# Patient Record
Sex: Female | Born: 1955 | Race: White | Hispanic: No | Smoking: Never smoker
Health system: Southern US, Community
[De-identification: ages and names within clinical notes are randomized; demographics above are authoritative.]

## PROBLEM LIST (undated history)

## (undated) DIAGNOSIS — N3010 Interstitial cystitis (chronic) without hematuria: Secondary | ICD-10-CM

## (undated) DIAGNOSIS — M1990 Unspecified osteoarthritis, unspecified site: Secondary | ICD-10-CM

## (undated) DIAGNOSIS — F419 Anxiety disorder, unspecified: Secondary | ICD-10-CM

## (undated) DIAGNOSIS — K3184 Gastroparesis: Secondary | ICD-10-CM

## (undated) DIAGNOSIS — E079 Disorder of thyroid, unspecified: Secondary | ICD-10-CM

## (undated) HISTORY — DX: Unspecified osteoarthritis, unspecified site: M19.90

## (undated) HISTORY — DX: Disorder of thyroid, unspecified: E07.9

## (undated) HISTORY — DX: Anxiety disorder, unspecified: F41.9

## (undated) HISTORY — DX: Gastroparesis: K31.84

## (undated) HISTORY — DX: Interstitial cystitis (chronic) without hematuria: N30.10

## (undated) HISTORY — PX: SPINE SURGERY: SHX786

---

## 2010-05-01 ENCOUNTER — Emergency Department: Payer: Self-pay | Admitting: Emergency Medicine

## 2015-02-06 ENCOUNTER — Other Ambulatory Visit: Payer: Self-pay | Admitting: Family Medicine

## 2015-02-06 DIAGNOSIS — R1900 Intra-abdominal and pelvic swelling, mass and lump, unspecified site: Secondary | ICD-10-CM

## 2015-02-13 ENCOUNTER — Ambulatory Visit
Admission: RE | Admit: 2015-02-13 | Discharge: 2015-02-13 | Disposition: A | Discharge status: Home / Self Care | Payer: BC Managed Care – PPO | Source: Ambulatory Visit | Attending: Family Medicine | Admitting: Family Medicine

## 2015-02-13 DIAGNOSIS — R1900 Intra-abdominal and pelvic swelling, mass and lump, unspecified site: Secondary | ICD-10-CM

## 2015-03-13 ENCOUNTER — Other Ambulatory Visit: Payer: Self-pay

## 2015-03-13 DIAGNOSIS — Z1231 Encounter for screening mammogram for malignant neoplasm of breast: Secondary | ICD-10-CM

## 2015-03-25 ENCOUNTER — Other Ambulatory Visit: Payer: Self-pay | Admitting: General Surgery

## 2015-03-25 DIAGNOSIS — R140 Abdominal distension (gaseous): Secondary | ICD-10-CM

## 2015-04-03 ENCOUNTER — Ambulatory Visit
Admission: RE | Admit: 2015-04-03 | Discharge: 2015-04-03 | Disposition: A | Discharge status: Home / Self Care | Payer: BC Managed Care – PPO | Source: Ambulatory Visit

## 2015-04-03 DIAGNOSIS — Z1231 Encounter for screening mammogram for malignant neoplasm of breast: Secondary | ICD-10-CM

## 2015-04-05 ENCOUNTER — Other Ambulatory Visit: Payer: Self-pay | Admitting: General Surgery

## 2015-04-05 DIAGNOSIS — R109 Unspecified abdominal pain: Secondary | ICD-10-CM

## 2015-04-08 ENCOUNTER — Ambulatory Visit: Payer: BC Managed Care – PPO | Attending: General Surgery | Admitting: Physical Therapy

## 2015-04-08 ENCOUNTER — Encounter: Payer: Self-pay | Admitting: Physical Therapy

## 2015-04-08 DIAGNOSIS — R109 Unspecified abdominal pain: Secondary | ICD-10-CM | POA: Insufficient documentation

## 2015-04-08 DIAGNOSIS — N8184 Pelvic muscle wasting: Secondary | ICD-10-CM | POA: Insufficient documentation

## 2015-04-08 DIAGNOSIS — M6289 Other specified disorders of muscle: Secondary | ICD-10-CM

## 2015-04-08 NOTE — Patient Instructions (Signed)
About Abdominal Massage  Abdominal massage, also called external colon massage, is a self-treatment circular massage technique that can reduce and eliminate gas and ease constipation. The colon naturally contracts in waves in a clockwise direction starting from inside the right hip, moving up toward the ribs, across the belly, and down inside the left hip.  When you perform circular abdominal massage, you help stimulate your colon's normal wave pattern of movement called peristalsis.  It is most beneficial when done after eating.  Positioning You can practice abdominal massage with oil while lying down, or in the shower with soap.  Some people find that it is just as effective to do the massage through clothing while sitting or standing.  How to Massage Start by placing your finger tips or knuckles on your right side, just inside your hip bone.  . Make small circular movements while you move upward toward your rib cage.   . Once you reach the bottom right side of your rib cage, take your circular movements across to the left side of the bottom of your rib cage.  . Next, move downward until you reach the inside of your left hip bone.  This is the path your feces travel in your colon. . Continue to perform your abdominal massage in this pattern for 10 minutes each day.     You can apply as much pressure as is comfortable in your massage.  Start gently and build pressure as you continue to practice.  Notice any areas of pain as you massage; areas of slight pain may be relieved as you massage, but if you have areas of significant or intense pain, consult with your healthcare provider.  Other Considerations . General physical activity including bending and stretching can have a beneficial massage-like effect on the colon.  Deep breathing can also stimulate the colon because breathing deeply activates the same nervous system that supplies the colon.   Abdominal massage should always be used in  combination with a bowel-conscious diet that is high in the proper type of fiber for you, fluids (primarily water), and a regular exercise program.   Butterfly, Supine    Lie on back, feet together. Lower knees toward floor. Hold _30__ seconds. Repeat _2_ times per session. Do _2__ sessions per day.  Copyright  VHI. All rights reserved.   Toileting Techniques for Bowel Movements (Defecation) Using your belly (abdomen) and pelvic floor muscles to have a bowel movement is usually instinctive.  Sometimes people can have problems with these muscles and have to relearn proper defecation (emptying) techniques.  If you have weakness in your muscles, organs that are falling out, decreased sensation in your pelvis, or ignore your urge to go, you may find yourself straining to have a bowel movement.  You are straining if you are: . holding your breath or taking in a huge gulp of air and holding it  . keeping your lips and jaw tensed and closed tightly . turning red in the face because of excessive pushing or forcing . developing or worsening your  hemorrhoids . getting faint while pushing . not emptying completely and have to defecate many times a day  If you are straining, you are actually making it harder for yourself to have a bowel movement.  Many people find they are pulling up with the pelvic floor muscles and closing off instead of opening the anus. Due to lack pelvic floor relaxation and coordination the abdominal muscles, one has to work harder to push  the feces out.  Many people have never been taught how to defecate efficiently and effectively.  Notice what happens to your body when you are having a bowel movement.  While you are sitting on the toilet pay attention to the following areas: . Jaw and mouth position . Angle of your hips   . Whether your feet touch the ground or not . Arm placement  . Spine position . Waist . Belly tension . Anus (opening of the anal canal)  An  Evacuation/Defecation Plan   Here are the 4 basic points:  1. Lean forward enough for your elbows to rest on your knees 2. Support your feet on the floor or use a low stool if your feet don't touch the floor  3. Push out your belly as if you have swallowed a beach ball-you should feel a widening of your waist 4. Open and relax your pelvic floor muscles, rather than tightening around the anus      The following conditions my require modifications to your toileting posture:  . If you have had surgery in the past that limits your back, hip, pelvic, knee or ankle flexibility . Constipation   Your healthcare practitioner may make the following additional suggestions and adjustments:  1) Sit on the toilet  a) Make sure your feet are supported. b) Notice your hip angle and spine position-most people find it effective to lean forward or raise their knees, which can help the muscles around the anus to relax  c) When you lean forward, place your forearms on your thighs for support  2) Relax suggestions a) Breath deeply in through your nose and out slowly through your mouth as if you are smelling the flowers and blowing out the candles. b) To become aware of how to relax your muscles, contracting and releasing muscles can be helpful.  Pull your pelvic floor muscles in tightly by using the image of holding back gas, or closing around the anus (visualize making a circle smaller) and lifting the anus up and in.  Then release the muscles and your anus should drop down and feel open. Repeat 5 times ending with the feeling of relaxation. c) Keep your pelvic floor muscles relaxed; let your belly bulge out. d) The digestive tract starts at the mouth and ends at the anal opening, so be sure to relax both ends of the tube.  Place your tongue on the roof of your mouth with your teeth separated.  This helps relax your mouth and will help to relax the anus at the same time.  3) Empty (defecation) a) Keep your  pelvic floor and sphincter relaxed, then bulge your anal muscles.  Make the anal opening wide.  b) Stick your belly out as if you have swallowed a beach ball. c) Make your belly wall hard using your belly muscles while continuing to breathe. Doing this makes it easier to open your anus. d) Breath out and give a grunt (or try using other sounds such as ahhhh, shhhhh, ohhhh or grrrrrrr).  4) Finish a) As you finish your bowel movement, pull the pelvic floor muscles up and in.  This will leave your anus in the proper place rather than remaining pushed out and down. If you leave your anus pushed out and down, it will start to feel as though that is normal and give you incorrect signals about needing to have a bowel movement.    Earlie Counts, PT [facility]  [address] [place], [place] [address] [facility] [address], [address] [place], [place] [address] Phone # [phone] Fax [phone]

## 2015-04-08 NOTE — Therapy (Signed)
[facility] [address] [place], [place], [address] Phone: [phone]   Fax:  [phone]  Physical Therapy Evaluation  Patient Details  Name: Selena Mckay MRN: [id number] Date of Birth: May 03, 1955 Referring Provider: Dr. Almond Lint  Encounter Date: 04/08/2015      PT End of Session - 04/08/15 1629    Visit Number 1   Date for PT Re-Evaluation 08/06/15   PT Start Time 1445   PT Stop Time 1530   PT Time Calculation (min) 45 min   Activity Tolerance Patient tolerated treatment well   Behavior During Therapy Anxious      Past Medical History  Diagnosis Date  . Arthritis     Rheumatoid arthritis  . Gastroparesis   . Interstitial cystitis   . Anxiety   . Thyroid disease     Past Surgical History  Procedure Laterality Date  . Spine surgery      3 posterior fusions; 1 anterior fusion    There were no vitals filed for this visit.  Visit Diagnosis:  Pelvic floor dysfunction - Plan: PT plan of care cert/re-cert  Abdominal pain, unspecified abdominal location - Plan: PT plan of care cert/re-cert      Subjective Assessment - 04/08/15 1455    Subjective Patient has to sleep with hot pack to relax bowels for gas.  Patient reports hard knots in intestines.  Patient reports after her anterior back fusion has had increased bowel function.  Patient reports incontinence issues.  Patient reports the bowels are soft and pencil thin. Patient has 1 bowel movement per day that are thin.  Patient feels bloated.    Patient Stated Goals massage out adhesions to improve quality of life.   Currently in Pain? Yes   Pain Score 5    Pain Location Abdomen   Pain Orientation Mid   Pain Descriptors / Indicators Aching;Heaviness;Discomfort  bloated   Pain Type Chronic pain   Pain Onset More than a month ago   Pain Frequency Constant   Aggravating Factors  sinus drainage, anxiety   Pain Relieving Factors hot pack   Multiple  Pain Sites No            [facility] PT Assessment - 04/08/15 0001    Assessment   Medical Diagnosis R10.9 Abdominal wall pain   Referring Provider Dr. Almond Lint   Onset Date/Surgical Date 06/26/08   Prior Therapy None   Precautions   Precautions Back  fusions   Balance Screen   Has the patient fallen in the past 6 months No   Has the patient had a decrease in activity level because of a fear of falling?  No   Is the patient reluctant to leave their home because of a fear of falling?  No   Prior Function   Level of Independence Independent   Leisure walking, back exercises   Cognition   Overall Cognitive Status Within Functional Limits for tasks assessed   Observation/Other Assessments   Observations Patient is very anxious   Focus on Therapeutic Outcomes (FOTO)  57% limitation CK   ROM / Strength   AROM / PROM / Strength Strength   Palpation   Palpation comment tenderness located in left abdominal wall,Healed scar on abdomen with thickness under scar                 Pelvic Floor Special Questions - 04/08/15 0001    Prior Pregnancies Yes   Number of Pregnancies 2  Number of Vaginal Deliveries 2   Any difficulty with labor and deliveries No   Currently Sexually Active Yes   Is this Painful Yes   Marinoff Scale pain interrupts completion   Urinary Leakage Yes   Activities that cause leaking --  straining to pass gas   Fecal incontinence No  strain for bowel movement   Falling out feeling (prolapse) Yes                  PT Education - 04/08/15 1629    Education provided Yes   Education Details website to work on relaxation, Correct toileting technique to relax the pelvic floor, squatty potty, abdominal massage   Person(s) Educated Patient   Methods Explanation;Demonstration;Verbal cues;Handout;Tactile cues   Comprehension Returned demonstration;Verbalized understanding          PT Short Term Goals - 04/08/15 1638    PT SHORT TERM GOAL #1    Title understand correct toileting technique to decrease straining   Time 4   Period Weeks   Status New   PT SHORT TERM GOAL #2   Title relaxation technique with audio and diaphragmatic breathing   Time 4   Period Weeks   Status New   PT SHORT TERM GOAL #3   Title understand how to relax pelvic floor while having a bowel movement   Time 4   Period Weeks   Status New   PT SHORT TERM GOAL #4   Title understand how to perform abdominal massage to  assist bowels to move through the intestines   Time 4   Period Weeks   Status New           PT Long Term Goals - 04/08/15 1641    PT LONG TERM GOAL #1   Title independent with HEP and how to manage her pain   Time 4   Period Months   Status New   PT LONG TERM GOAL #2   Title pain at night in the abdomen decreased >/= 50% using relaxation techniques and massage   Time 4   Period Months   Status New   PT LONG TERM GOAL #3   Title have a bowel movement with >/= 50% greater ease due to understanding how to relax the pelvic floor as she pushes   Time 4   Period Months   Status New   PT LONG TERM GOAL #4   Title feels her quality of life has improved >/= 50% due to reduced pain   Time 4   Status New               Plan - 04/08/15 1630    Clinical Impression Statement Patient is a 60 year old female with diagnosis of abdominal pain.Patient reports she has constipation issues with straining.  Patient has 1 bowel movement per day that is very skinny.  Patient will leak urine when passing gas.  Patient has abdominal pain at level  5/10 and she will massage the area vigorusly or use a hot pack.  Patient sleeps with a hot pack on her abdomen and has mottling of her abdominal skin from the heat.  Patient is very anxious with her syptoms and has trouble relaxing.  Patient resting level and muscle recruitment with pelvic floor relaxation and contraction will be assessed at next visit.  Patient  would benefit form phsycial therapy to  redue pain in abdomen, educate on how to relax pelvic floor muscles to have a bowel movement  or gas, pain management techniques to redue the amount of heat to her abdomen.     Pt will benefit from skilled therapeutic intervention in order to improve on the following deficits Decreased strength;Decreased coordination;Pain;Increased muscle spasms   Rehab Potential Good   Clinical Impairments Affecting Rehab Potential None   PT Frequency 1x / week   PT Duration Other (comment)  4 months   PT Treatment/Interventions ADLs/Self Care Home Management;Biofeedback;Moist Heat;Therapeutic exercise;Electrical Stimulation;Cryotherapy;Therapeutic activities;Ultrasound;Neuromuscular re-education;Patient/family education;Manual techniques;Scar mobilization  Home TENS unit   PT Next Visit Plan pelvic floor EMG, flexibility exercises with legs on wall   PT Home Exercise Plan flexibility exercises with feet on wall,, diaphragmatic breathing   Recommended Other Services None   Consulted and Agree with Plan of Care Patient         Problem List There are no active problems to display for this patient.   Eulis FosterCheryl Gray, PT 04/08/2015 4:46 PM    [facility] [address] [place], [place], [address] Phone: [phone]   Fax:  [phone]  Name: Selena Mckay MRN: [id number] Date of Birth: 08/18/1955

## 2015-04-16 ENCOUNTER — Encounter: Payer: Self-pay | Admitting: Physical Therapy

## 2015-04-16 ENCOUNTER — Ambulatory Visit: Payer: BC Managed Care – PPO | Admitting: Physical Therapy

## 2015-04-16 DIAGNOSIS — N8184 Pelvic muscle wasting: Secondary | ICD-10-CM | POA: Diagnosis not present

## 2015-04-16 DIAGNOSIS — R109 Unspecified abdominal pain: Secondary | ICD-10-CM

## 2015-04-16 DIAGNOSIS — M6289 Other specified disorders of muscle: Secondary | ICD-10-CM

## 2015-04-16 NOTE — Therapy (Signed)
[facility] [address] [place], [place], [address] Phone: [phone]   Fax:  [phone]  Physical Therapy Treatment  Patient Details  Name: Selena Mckay MRN: [id number] Date of Birth: 1955/05/03 Referring Provider: Dr. Stark Klein  Encounter Date: 04/16/2015      PT End of Session - 04/16/15 1528    Visit Number 2   Date for PT Re-Evaluation 08/06/15   PT Start Time 1445   PT Stop Time 1528   PT Time Calculation (min) 43 min   Activity Tolerance Patient tolerated treatment well   Behavior During Therapy Anxious      Past Medical History  Diagnosis Date  . Arthritis     Rheumatoid arthritis  . Gastroparesis   . Interstitial cystitis   . Anxiety   . Thyroid disease     Past Surgical History  Procedure Laterality Date  . Spine surgery      3 posterior fusions; 1 anterior fusion    There were no vitals filed for this visit.  Visit Diagnosis:  Pelvic floor dysfunction  Abdominal pain, unspecified abdominal location      Subjective Assessment - 04/16/15 1448    Subjective Today I have alot of sinus drainage today causing lots of stomach gas.  Abdominal massage will work when gas is mild.  When gas is severe massage does not help.  Deep breaths will help with gastroparesis.   Istarted back with the meditation.    Patient Stated Goals massage out adhesions to improve quality of life.   Currently in Pain? Yes   Pain Score 4    Pain Location Chest   Pain Orientation Upper   Pain Descriptors / Indicators Tiring   Pain Type Chronic pain   Pain Onset More than a month ago   Pain Frequency Constant   Aggravating Factors  sinus drainage, anxiety   Pain Relieving Factors hot pack   Multiple Pain Sites No                         [facility] Adult PT Treatment/Exercise - 04/16/15 0001    Manual Therapy   Manual Therapy Soft tissue mobilization;Myofascial release   Soft tissue mobilization  scar massage to abdomin; diaphragm bil. ; lift up of scar   Myofascial Release abdominal , lower intestine area, pubivesical ligaments                PT Education - 04/16/15 1525    Education provided Yes   Education Details butterfly stretch, hamstring stretch   Person(s) Educated Patient   Methods Explanation;Handout;Verbal cues   Comprehension Verbalized understanding          PT Short Term Goals - 04/16/15 1530    PT SHORT TERM GOAL #1   Title understand correct toileting technique to decrease straining   Time 4   Period Weeks   Status Achieved   PT SHORT TERM GOAL #2   Title relaxation technique with audio and diaphragmatic breathing   Time 4   Period Weeks   Status On-going   PT SHORT TERM GOAL #3   Title understand how to relax pelvic floor while having a bowel movement   Time 4   Period Weeks   Status On-going   PT SHORT TERM GOAL #4   Title understand how to perform abdominal massage to  assist bowels to move through the intestines   Time 4   Period Weeks  Status Achieved           PT Long Term Goals - 04/08/15 1641    PT LONG TERM GOAL #1   Title independent with HEP and how to manage her pain   Time 4   Period Months   Status New   PT LONG TERM GOAL #2   Title pain at night in the abdomen decreased >/= 50% using relaxation techniques and massage   Time 4   Period Months   Status New   PT LONG TERM GOAL #3   Title have a bowel movement with >/= 50% greater ease due to understanding how to relax the pelvic floor as she pushes   Time 4   Period Months   Status New   PT LONG TERM GOAL #4   Title feels her quality of life has improved >/= 50% due to reduced pain   Time 4   Status New               Plan - 04/16/15 1528    Clinical Impression Statement Patient is a 59 year old female with diagnosis of abdominal pain.  Patient is performing relaxation exercises at home.  Patient had increased softness of scar and abdomen after soft  tissue work and good intestinal sounds.  Patient has not met goals yet due to only attending 1 session. patient will benefit from physical therapy to reduce pain .    Pt will benefit from skilled therapeutic intervention in order to improve on the following deficits Decreased strength;Decreased coordination;Pain;Increased muscle spasms   Rehab Potential Good   Clinical Impairments Affecting Rehab Potential None   PT Frequency 1x / week   PT Duration Other (comment)  4 months   PT Treatment/Interventions ADLs/Self Care Home Management;Biofeedback;Moist Heat;Therapeutic exercise;Electrical Stimulation;Cryotherapy;Therapeutic activities;Ultrasound;Neuromuscular re-education;Patient/family education;Manual techniques;Scar mobilization  home TENS unit   PT Next Visit Plan pelvic floor EMG, flexibility exercises with legs on wall   PT Home Exercise Plan flexibility exercises with feet on wall,, diaphragmatic breathing   Consulted and Agree with Plan of Care Patient        Problem List There are no active problems to display for this patient.   Cheryl Gray, PT 04/16/2015 3:31 PM    [facility] [address] [place], [place], [address] Phone: [phone]   Fax:  [phone]  Name: Cathye Tagg MRN: [id number] Date of Birth: 04/30/1955     

## 2015-04-16 NOTE — Patient Instructions (Signed)
Hip Adductor: Wall Stretch    Lie on back with hips against wall, back of thighs on wall. Pull legs apart until stretch is felt in inner thighs. Hold _30__ seconds. Relax. Repeat _2__ times. Do __1_ times a day. Advanced: At end of stretch, rotate thighs outward. Can do the butterfly stretch too.  Copyright  VHI. All rights reserved.  [facility] [address] [place], [place] [address] Phone # [phone] Fax [phone]

## 2015-04-23 ENCOUNTER — Ambulatory Visit: Payer: BC Managed Care – PPO | Admitting: Physical Therapy

## 2015-04-23 ENCOUNTER — Encounter: Payer: Self-pay | Admitting: Physical Therapy

## 2015-04-23 DIAGNOSIS — M6289 Other specified disorders of muscle: Secondary | ICD-10-CM

## 2015-04-23 DIAGNOSIS — R109 Unspecified abdominal pain: Secondary | ICD-10-CM

## 2015-04-23 DIAGNOSIS — N8184 Pelvic muscle wasting: Secondary | ICD-10-CM | POA: Diagnosis not present

## 2015-04-23 NOTE — Therapy (Signed)
[facility] [address] [place], [place], [address] Phone: [phone]   Fax:  [phone]  Physical Therapy Treatment  Patient Details  Name: Selena Mckay MRN: [id number] Date of Birth: [birth year] Referring Provider: Dr. Almond Lint  Encounter Date: 04/23/2015      PT End of Session - 04/23/15 1445    Visit Number 3   Date for PT Re-Evaluation 08/06/15   PT Start Time 1445   PT Stop Time 1525   PT Time Calculation (min) 40 min   Activity Tolerance Patient tolerated treatment well   Behavior During Therapy Anxious      Past Medical History  Diagnosis Date  . Arthritis     Rheumatoid arthritis  . Gastroparesis   . Interstitial cystitis   . Anxiety   . Thyroid disease     Past Surgical History  Procedure Laterality Date  . Spine surgery      3 posterior fusions; 1 anterior fusion    There were no vitals filed for this visit.  Visit Diagnosis:  Pelvic floor dysfunction  Abdominal pain, unspecified abdominal location      Subjective Assessment - 04/23/15 1445    Subjective I am coping better and have less stress.  I am still having alot of abdominal gas.   I am not panicing as much as I used to. My stomach is softer.    Patient Stated Goals massage out adhesions to improve quality of life.   Currently in Pain? Yes   Pain Score 8   during day is 2/10   Pain Location Abdomen   Pain Orientation Lower;Mid   Pain Descriptors / Indicators Constant;Tightness   Pain Type Chronic pain   Pain Onset More than a month ago   Pain Frequency Constant   Aggravating Factors  sinus drainage, anxiety   Pain Relieving Factors hot pack   Multiple Pain Sites No                         [facility] Adult PT Treatment/Exercise - 04/23/15 0001    Manual Therapy   Manual Therapy Soft tissue mobilization;Myofascial release   Soft tissue mobilization scar massage to abdomin; diaphragm bil. ; lift up of  scar   Myofascial Release abdominal , lower intestine area, pubivesical ligaments; around the large intestines,                 PT Education - 04/23/15 1527    Education provided Yes   Education Details diaphragm   Person(s) Educated Patient   Methods Explanation;Demonstration;Verbal cues;Handout   Comprehension Returned demonstration;Verbalized understanding          PT Short Term Goals - 04/23/15 1528    PT SHORT TERM GOAL #1   Title understand correct toileting technique to decrease straining   Time 4   Period Weeks   Status Achieved   PT SHORT TERM GOAL #2   Title relaxation technique with audio and diaphragmatic breathing   Time 4   Period Weeks   Status Achieved   PT SHORT TERM GOAL #3   Title understand how to relax pelvic floor while having a bowel movement   Period Weeks   Status On-going   PT SHORT TERM GOAL #4   Title understand how to perform abdominal massage to  assist bowels to move through the intestines   Time 4   Status Achieved  PT Long Term Goals - 04/08/15 1641    PT LONG TERM GOAL #1   Title independent with HEP and how to manage her pain   Time 4   Period Months   Status New   PT LONG TERM GOAL #2   Title pain at night in the abdomen decreased >/= 50% using relaxation techniques and massage   Time 4   Period Months   Status New   PT LONG TERM GOAL #3   Title have a bowel movement with >/= 50% greater ease due to understanding how to relax the pelvic floor as she pushes   Time 4   Period Months   Status New   PT LONG TERM GOAL #4   Title feels her quality of life has improved >/= 50% due to reduced pain   Time 4   Status New               Plan - 04/23/15 1528    Clinical Impression Statement Patient is a 60 year old female with diagnosis of abdominal pain.  Patient had increased gas pains but she is abel to relax easier . Patient had inceased tightness in diahpragm, pubovesical ligaments, and sarge  intestines.  Patient abdomen is becoming softer.  Patietn was instructed on how to do diaphragmatic breathing to stretch the diaphragm.  Patient will benefit from skilled therapy to relax the pelvic floor and decrease stightness of scar tissue.    Pt will benefit from skilled therapeutic intervention in order to improve on the following deficits Decreased strength;Decreased coordination;Pain;Increased muscle spasms   Rehab Potential Good   Clinical Impairments Affecting Rehab Potential None   PT Frequency 1x / week   PT Duration Other (comment)  4 months   PT Treatment/Interventions ADLs/Self Care Home Management;Biofeedback;Moist Heat;Therapeutic exercise;Electrical Stimulation;Cryotherapy;Therapeutic activities;Ultrasound;Neuromuscular re-education;Patient/family education;Manual techniques;Scar mobilization  Home TENS unit   PT Next Visit Plan pelvic floor EMG, flexibility exercises with legs on wall; sof tissue work   PT Home Exercise Plan flexibility exercises with feet on wall,,    Consulted and Agree with Plan of Care Patient        Problem List There are no active problems to display for this patient.   Eulis Foster, PT 04/23/2015 3:32 PM   [facility] [address], [address] [place], [place], [address] Phone: [phone]   Fax:  [phone]  Name: Selena Mckay MRN: [id number] Date of Birth: 1955-11-30

## 2015-04-23 NOTE — Patient Instructions (Signed)
Sitting    Sit comfortably. Allow body's muscles to relax. Place hands on belly. Inhale slowly and deeply for _3__ seconds, so hands move out. Then take _3__ seconds to exhale. Repeat _5__ times. Do _4__ times a day.  Copyright  VHI. All rights reserved.  [facility] [address] [place], [place] [address] Phone # [phone] Fax [phone]

## 2015-04-30 ENCOUNTER — Ambulatory Visit: Payer: BC Managed Care – PPO | Attending: General Surgery | Admitting: Physical Therapy

## 2015-04-30 ENCOUNTER — Encounter: Payer: Self-pay | Admitting: Physical Therapy

## 2015-04-30 DIAGNOSIS — R109 Unspecified abdominal pain: Secondary | ICD-10-CM | POA: Insufficient documentation

## 2015-04-30 DIAGNOSIS — M6289 Other specified disorders of muscle: Secondary | ICD-10-CM

## 2015-04-30 DIAGNOSIS — N8184 Pelvic muscle wasting: Secondary | ICD-10-CM | POA: Diagnosis present

## 2015-04-30 NOTE — Therapy (Signed)
[facility] [address], [address] [place], [place], [address] Phone: [phone]   Fax:  [phone]  Physical Therapy Treatment  Patient Details  Name: Selena Mckay MRN: [id number] Date of Birth: May 26, 1955 Referring Provider: Dr. Almond Lint  Encounter Date: 04/30/2015      PT End of Session - 04/30/15 1450    Visit Number 4   Date for PT Re-Evaluation 08/06/15   PT Start Time 1448   PT Stop Time 1528   PT Time Calculation (min) 40 min   Activity Tolerance Patient tolerated treatment well   Behavior During Therapy Anxious      Past Medical History  Diagnosis Date  . Arthritis     Rheumatoid arthritis  . Gastroparesis   . Interstitial cystitis   . Anxiety   . Thyroid disease     Past Surgical History  Procedure Laterality Date  . Spine surgery      3 posterior fusions; 1 anterior fusion    There were no vitals filed for this visit.  Visit Diagnosis:  Pelvic floor dysfunction  Abdominal pain, unspecified abdominal location      Subjective Assessment - 04/30/15 1450    Subjective I felt better after treatment.  The deep breathing exercises make me feel strange in the chest but I still so them. I only had one spasm after last visit and no more since then.  My bowel movements are coming more regular until I ate triscuts. Bowel movements are thin and started when I got real sick. I feel tired and fatiqued today but no pain. I am not using the heat every night.    Patient Stated Goals massage out adhesions to improve quality of life.   Currently in Pain? No/denies                         [facility] Adult PT Treatment/Exercise - 04/30/15 0001    Manual Therapy   Manual Therapy Soft tissue mobilization;Myofascial release   Soft tissue mobilization scar massage to abdomin; diaphragm bil. ; lift up of scar   Myofascial Release abdominal , lower intestine area, pubivesical ligaments; around the large  intestines,                 PT Education - 04/30/15 1519    Education provided No          PT Short Term Goals - 04/30/15 1454    PT SHORT TERM GOAL #1   Title understand correct toileting technique to decrease straining   Time 4   Period Weeks   Status Achieved   PT SHORT TERM GOAL #2   Title relaxation technique with audio and diaphragmatic breathing   Time 4   Period Weeks   Status Achieved   PT SHORT TERM GOAL #3   Title understand how to relax pelvic floor while having a bowel movement   Time 4   Period Weeks   Status On-going   PT SHORT TERM GOAL #4   Title understand how to perform abdominal massage to  assist bowels to move through the intestines   Time 4   Period Weeks   Status Achieved           PT Long Term Goals - 04/08/15 1641    PT LONG TERM GOAL #1   Title independent with HEP and how to manage her pain   Time 4   Period Months   Status New  PT LONG TERM GOAL #2   Title pain at night in the abdomen decreased >/= 50% using relaxation techniques and massage   Time 4   Period Months   Status New   PT LONG TERM GOAL #3   Title have a bowel movement with >/= 50% greater ease due to understanding how to relax the pelvic floor as she pushes   Time 4   Period Months   Status New   PT LONG TERM GOAL #4   Title feels her quality of life has improved >/= 50% due to reduced pain   Time 4   Status New               Plan - 04/30/15 1524    Clinical Impression Statement Patient is a 60 year old female with diagnosis of abdominal pain.  Patient reports she was able to belch on her own for the first time.  Patient was having regular bowel movements but they are skinny.  Patient has not pain today.  Patient is not using the heat as much and does not always use it on hight. Patient  is using her relaxation exercises.  Patient will benefit from skilled therapy to improve relaxation of the pelvic floor and reduce abdominal pain.    Pt will  benefit from skilled therapeutic intervention in order to improve on the following deficits Decreased strength;Decreased coordination;Pain;Increased muscle spasms   Rehab Potential Good   Clinical Impairments Affecting Rehab Potential None   PT Frequency 1x / week   PT Duration Other (comment)  4 months   PT Treatment/Interventions ADLs/Self Care Home Management;Biofeedback;Moist Heat;Therapeutic exercise;Electrical Stimulation;Cryotherapy;Therapeutic activities;Ultrasound;Neuromuscular re-education;Patient/family education;Manual techniques;Scar mobilization  HOme TENS unity   PT Next Visit Plan pelvic floor EMG, sof tissue work   PT Home Exercise Plan progress as needed   Recommended Other Services None   Consulted and Agree with Plan of Care Patient        Problem List There are no active problems to display for this patient.   Eulis Foster, PT 04/30/2015 3:28 PM    [facility] [address], [address] [place], [place], [address] Phone: [phone]   Fax:  [phone]  Name: Selena Mckay MRN: [id number] Date of Birth: 1955-07-27

## 2015-05-07 ENCOUNTER — Ambulatory Visit: Payer: BC Managed Care – PPO | Admitting: Physical Therapy

## 2015-05-07 ENCOUNTER — Encounter: Payer: Self-pay | Admitting: Physical Therapy

## 2015-05-07 DIAGNOSIS — R109 Unspecified abdominal pain: Secondary | ICD-10-CM

## 2015-05-07 DIAGNOSIS — N8184 Pelvic muscle wasting: Secondary | ICD-10-CM | POA: Diagnosis not present

## 2015-05-07 DIAGNOSIS — M6289 Other specified disorders of muscle: Secondary | ICD-10-CM

## 2015-05-07 NOTE — Therapy (Signed)
[facility] [address] [place], [place], [address] Phone: [phone]   Fax:  [phone]  Physical Therapy Treatment  Patient Details  Name: Selena Mckay MRN: [id number] Date of Birth: 06/05/1955 Referring Provider: Dr. Almond Lint  Encounter Date: 05/07/2015      PT End of Session - 05/07/15 1504    Visit Number 5   Date for PT Re-Evaluation 08/06/15   PT Start Time 1445   PT Stop Time 1530   PT Time Calculation (min) 45 min   Activity Tolerance Patient tolerated treatment well   Behavior During Therapy Anxious      Past Medical History  Diagnosis Date  . Arthritis     Rheumatoid arthritis  . Gastroparesis   . Interstitial cystitis   . Anxiety   . Thyroid disease     Past Surgical History  Procedure Laterality Date  . Spine surgery      3 posterior fusions; 1 anterior fusion    There were no vitals filed for this visit.  Visit Diagnosis:  Pelvic floor dysfunction  Abdominal pain, unspecified abdominal location                       [facility] Adult PT Treatment/Exercise - 05/07/15 0001    Self-Care   Self-Care Other Self-Care Comments   Other Self-Care Comments  Discussed with patient on if therapy is helping during the day but not at night.  Discussed with patient to sleep on an incline to reduce the gas and heart pounding.  Gave patient some information on holistic gastrointologist. Discussed with patient for 15 min.    Manual Therapy   Manual Therapy Soft tissue mobilization;Myofascial release   Soft tissue mobilization scar massage to abdomin; diaphragm bil. ; lift up of scar   Myofascial Release abdominal , lower intestine area, pubivesical ligaments; around the large intestines,                 PT Education - 05/07/15 1554    Education provided Yes   Education Details information on holistic gastrointologist   Person(s) Educated Patient   Methods Explanation   Comprehension Verbalized understanding          PT Short Term Goals - 04/30/15 1454    PT SHORT TERM GOAL #1   Title understand correct toileting technique to decrease straining   Time 4   Period Weeks   Status Achieved   PT SHORT TERM GOAL #2   Title relaxation technique with audio and diaphragmatic breathing   Time 4   Period Weeks   Status Achieved   PT SHORT TERM GOAL #3   Title understand how to relax pelvic floor while having a bowel movement   Time 4   Period Weeks   Status On-going   PT SHORT TERM GOAL #4   Title understand how to perform abdominal massage to  assist bowels to move through the intestines   Time 4   Period Weeks   Status Achieved           PT Long Term Goals - 04/08/15 1641    PT LONG TERM GOAL #1   Title independent with HEP and how to manage her pain   Time 4   Period Months   Status New   PT LONG TERM GOAL #2   Title pain at night in the abdomen decreased >/= 50% using relaxation techniques and massage   Time 4  Period Months   Status New   PT LONG TERM GOAL #3   Title have a bowel movement with >/= 50% greater ease due to understanding how to relax the pelvic floor as she pushes   Time 4   Period Months   Status New   PT LONG TERM GOAL #4   Title feels her quality of life has improved >/= 50% due to reduced pain   Time 4   Status New               Plan - 05/07/15 1653    Clinical Impression Statement Patient is s 60 year old female with diagnosis of abdominal pain.  Patient continues to be able to belch and pass gas during the day but not at night.  Patient will have gas pain that causes her heart to pound  in the middle of the night and unable to release the gas.  This is making patient anxious.  Patient reports her goal in therapy is ot sleep through the night without the gas pain. Patient will benefit from skilled therapy to reduce the adhesions abdominally to reduce the gas pain.    Pt will benefit from skilled  therapeutic intervention in order to improve on the following deficits Decreased strength;Decreased coordination;Pain;Increased muscle spasms   Rehab Potential Good   Clinical Impairments Affecting Rehab Potential None   PT Frequency 1x / week   PT Duration Other (comment)  4 months   PT Treatment/Interventions ADLs/Self Care Home Management;Biofeedback;Moist Heat;Therapeutic exercise;Electrical Stimulation;Cryotherapy;Therapeutic activities;Ultrasound;Neuromuscular re-education;Patient/family education;Manual techniques;Scar mobilization  home tens unity   PT Next Visit Plan pelvic floor EMG, sof tissue work   PT Home Exercise Plan progress as needed   Consulted and Agree with Plan of Care Patient        Problem List There are no active problems to display for this patient.   Eulis Foster, PT 05/07/2015 4:58 PM    [facility] [address], [address] [place], [place], [address] Phone: [phone]   Fax:  [phone]  Name: Selena Mckay MRN: [id number] Date of Birth: 1956-03-28

## 2015-05-13 ENCOUNTER — Other Ambulatory Visit: Payer: Self-pay | Admitting: Family Medicine

## 2015-05-13 ENCOUNTER — Other Ambulatory Visit (HOSPITAL_COMMUNITY)
Admission: RE | Admit: 2015-05-13 | Discharge: 2015-05-13 | Disposition: A | Discharge status: Home / Self Care | Payer: BC Managed Care – PPO | Source: Ambulatory Visit | Attending: Family Medicine | Admitting: Family Medicine

## 2015-05-13 DIAGNOSIS — Z1151 Encounter for screening for human papillomavirus (HPV): Secondary | ICD-10-CM | POA: Insufficient documentation

## 2015-05-13 DIAGNOSIS — Z01411 Encounter for gynecological examination (general) (routine) with abnormal findings: Secondary | ICD-10-CM | POA: Insufficient documentation

## 2015-05-14 ENCOUNTER — Ambulatory Visit: Payer: BC Managed Care – PPO | Admitting: Physical Therapy

## 2015-05-14 ENCOUNTER — Encounter: Payer: Self-pay | Admitting: Physical Therapy

## 2015-05-14 DIAGNOSIS — N8184 Pelvic muscle wasting: Secondary | ICD-10-CM | POA: Diagnosis not present

## 2015-05-14 DIAGNOSIS — R109 Unspecified abdominal pain: Secondary | ICD-10-CM

## 2015-05-14 DIAGNOSIS — M6289 Other specified disorders of muscle: Secondary | ICD-10-CM

## 2015-05-14 NOTE — Therapy (Signed)
[facility] [address], [address] [place], [place], [address] Phone: [phone]   Fax:  [phone]  Physical Therapy Treatment  Patient Details  Name: Selena Mckay MRN: [id number] Date of Birth: January 25, 1956 Referring Provider: Dr. Almond Lint  Encounter Date: 05/14/2015      PT End of Session - 05/14/15 1528    Visit Number 6   Date for PT Re-Evaluation 08/06/15   PT Start Time 1445   PT Stop Time 1525   PT Time Calculation (min) 40 min   Activity Tolerance Patient tolerated treatment well   Behavior During Therapy [facility] for tasks assessed/performed      Past Medical History  Diagnosis Date  . Arthritis     Rheumatoid arthritis  . Gastroparesis   . Interstitial cystitis   . Anxiety   . Thyroid disease     Past Surgical History  Procedure Laterality Date  . Spine surgery      3 posterior fusions; 1 anterior fusion    There were no vitals filed for this visit.  Visit Diagnosis:  Pelvic floor dysfunction  Abdominal pain, unspecified abdominal location      Subjective Assessment - 05/14/15 1450    Subjective I see Dr. Donell Beers on 05/25/2015.  I am now having a full diameter of bowel and having a more normal bowel movement.  The knot to the right of the left umbilicus is gone.  I have one more know that wakes me  up  and the upper abdomen is causing me trouble.  I had a tetnus shot that has caused incresaed drainage.    Patient Stated Goals massage out adhesions to improve quality of life.   Currently in Pain? No/denies                         [facility] Adult PT Treatment/Exercise - 05/14/15 0001    Manual Therapy   Manual Therapy Soft tissue mobilization;Myofascial release   Soft tissue mobilization scar massage to abdomin; diaphragm bil. ; lift up of scar   Myofascial Release abdominal , lower intestine area, pubivesical ligaments; around the large intestines,                   PT  Short Term Goals - 05/14/15 1523    PT SHORT TERM GOAL #3   Title understand how to relax pelvic floor while having a bowel movement   Time 4   Period Weeks   Status Achieved           PT Long Term Goals - 04/08/15 1641    PT LONG TERM GOAL #1   Title independent with HEP and how to manage her pain   Time 4   Period Months   Status New   PT LONG TERM GOAL #2   Title pain at night in the abdomen decreased >/= 50% using relaxation techniques and massage   Time 4   Period Months   Status New   PT LONG TERM GOAL #3   Title have a bowel movement with >/= 50% greater ease due to understanding how to relax the pelvic floor as she pushes   Time 4   Period Months   Status New   PT LONG TERM GOAL #4   Title feels her quality of life has improved >/= 50% due to reduced pain   Time 4   Status New  Plan - 05/14/15 1524    Clinical Impression Statement Patient is a 60 year old female.  Patient is having daily bowel movements that are larger in diameter with greater ease.  Patient reports she is belching and passing gas with greater ease.  Patient is hearing more intestinal sounds due to improve intestinal mobility.  Patient has less trigger points in the abdomen. Patient would benfit from skilled therapy to improve mobility.    Pt will benefit from skilled therapeutic intervention in order to improve on the following deficits Decreased strength;Decreased coordination;Pain;Increased muscle spasms   Rehab Potential Good   Clinical Impairments Affecting Rehab Potential None   PT Frequency 1x / week   PT Duration Other (comment)  4 months   PT Treatment/Interventions ADLs/Self Care Home Management;Biofeedback;Moist Heat;Therapeutic exercise;Electrical Stimulation;Cryotherapy;Therapeutic activities;Ultrasound;Neuromuscular re-education;Patient/family education;Manual techniques;Scar mobilization  home TENS unit   PT Next Visit Plan soft tissue work, progress note to MD    PT Home Exercise Plan progress as needed   Consulted and Agree with Plan of Care Patient        Problem List There are no active problems to display for this patient.   Eulis Foster, PT 05/14/2015 3:29 PM   [facility] [address] [place], [place], [address] Phone: [phone]   Fax:  [phone]  Name: Selena Mckay MRN: [id number] Date of Birth: [birth year]

## 2015-05-15 LAB — CYTOLOGY - PAP

## 2015-06-02 ENCOUNTER — Encounter: Payer: Self-pay | Admitting: Physical Therapy

## 2015-06-02 ENCOUNTER — Ambulatory Visit: Payer: BC Managed Care – PPO | Attending: General Surgery | Admitting: Physical Therapy

## 2015-06-02 DIAGNOSIS — M6289 Other specified disorders of muscle: Secondary | ICD-10-CM

## 2015-06-02 DIAGNOSIS — R109 Unspecified abdominal pain: Secondary | ICD-10-CM

## 2015-06-02 DIAGNOSIS — N8184 Pelvic muscle wasting: Secondary | ICD-10-CM | POA: Insufficient documentation

## 2015-06-02 NOTE — Therapy (Signed)
[facility] [address] [place], [place], [address] Phone: [phone]   Fax:  [phone]  Physical Therapy Treatment  Patient Details  Name: Selena Mckay MRN: [id number] Date of Birth: 1955/06/20 Referring Provider: Dr. Stark Klein  Encounter Date: 06/02/2015      PT End of Session - 06/02/15 1438    Visit Number 7   Date for PT Re-Evaluation 08/06/15   PT Start Time 1400   PT Stop Time 1438   PT Time Calculation (min) 38 min   Activity Tolerance Patient tolerated treatment well   Behavior During Therapy [facility] for tasks assessed/performed      Past Medical History  Diagnosis Date  . Arthritis     Rheumatoid arthritis  . Gastroparesis   . Interstitial cystitis   . Anxiety   . Thyroid disease     Past Surgical History  Procedure Laterality Date  . Spine surgery      3 posterior fusions; 1 anterior fusion    There were no vitals filed for this visit.  Visit Diagnosis:  Pelvic floor dysfunction  Abdominal pain, unspecified abdominal location      Subjective Assessment - 06/02/15 1406    Subjective I am slowly doing better.  I have had sinus drainage that affects my stomach. I feel full today.  I feel better when you do the abdominal massage.    Patient Stated Goals massage out adhesions to improve quality of life.   Currently in Pain? No/denies                         [facility] Adult PT Treatment/Exercise - 06/02/15 0001    Manual Therapy   Manual Therapy Soft tissue mobilization;Myofascial release   Soft tissue mobilization scar massage to abdomin; diaphragm bil. ; lift up of scar   Myofascial Release abdominal , lower intestine area, pubivesical ligaments; around the large intestines,                 PT Education - 06/02/15 1438    Education provided Yes   Education Details flexibility exericses   Person(s) Educated Patient   Methods Explanation;Demonstration;Verbal  cues;Handout   Comprehension Returned demonstration;Verbalized understanding          PT Short Term Goals - 05/14/15 1523    PT SHORT TERM GOAL #3   Title understand how to relax pelvic floor while having a bowel movement   Time 4   Period Weeks   Status Achieved           PT Long Term Goals - 06/02/15 1441    PT LONG TERM GOAL #1   Title independent with HEP and how to manage her pain   Time 4   Period Months   Status On-going   PT LONG TERM GOAL #2   Title pain at night in the abdomen decreased >/= 50% using relaxation techniques and massage   Time 4   Period Months   Status On-going   PT LONG TERM GOAL #3   Title have a bowel movement with >/= 50% greater ease due to understanding how to relax the pelvic floor as she pushes   Time 4   Period Months   Status On-going   PT LONG TERM GOAL #4   Title feels her quality of life has improved >/= 50% due to reduced pain   Time 4   Period Weeks   Status On-going  Plan - 06/02/15 1439    Clinical Impression Statement Patient is a 60 year old female wtih pelvic floor dysfunction.  Patient bowel movements are easier when her feet are on the stool. Patient was able to sleep through the night for the first time.  Patient has not met goals today but is progressing.  Patient will focus on pelvic floor relaxtion and coordination.    Pt will benefit from skilled therapeutic intervention in order to improve on the following deficits Decreased strength;Decreased coordination;Pain;Increased muscle spasms   Rehab Potential Good   Clinical Impairments Affecting Rehab Potential None   PT Frequency 1x / week   PT Duration Other (comment)  4 months   PT Treatment/Interventions ADLs/Self Care Home Management;Biofeedback;Moist Heat;Therapeutic exercise;Electrical Stimulation;Cryotherapy;Therapeutic activities;Ultrasound;Neuromuscular re-education;Patient/family education;Manual techniques;Scar mobilization   PT Next  Visit Plan soft tissue work, pelvic floor EMG   PT Home Exercise Plan progress as needed   Consulted and Agree with Plan of Care Patient        Problem List There are no active problems to display for this patient.   Earlie Counts, PT 06/02/2015 2:42 PM   [facility] [address], [place], [place], [address] Phone: [phone]   Fax:  [phone]  Name: Xinyi Batton MRN: [id number] Date of Birth: Jul 26, 1955

## 2015-06-02 NOTE — Patient Instructions (Signed)
Outer Hip Stretch: Figure Four (Wall)    Hips centered (do not jockey to side), adjust distance from wall and bend of supporting leg for less deep stretch. Hold for _30___ breaths. Repeat _1___ times each leg. Then do the butterfly stretch with feet together and knees outward.  Hold 30 breaths 1 time.  Copyright  VHI. All rights reserved.  Hip Adductor: Wall Stretch    Lie on back with hips against wall, back of thighs on wall. Pull legs apart until stretch is felt in inner thighs. Hold _30__ seconds. Relax. Repeat _1__ times. Do __1_ times a day. Advanced: At end of stretch, rotate thighs outward.  Copyright  VHI. All rights reserved.  Eulis Fosterheryl Marcedes Tech, PT [facility] [address], [address] [place], [place] [address] Phone # [phone] Fax [phone] \

## 2015-06-17 ENCOUNTER — Ambulatory Visit: Payer: BC Managed Care – PPO | Admitting: Physical Therapy

## 2015-06-24 ENCOUNTER — Ambulatory Visit: Payer: BC Managed Care – PPO | Admitting: Physical Therapy

## 2015-06-24 ENCOUNTER — Encounter: Payer: Self-pay | Admitting: Physical Therapy

## 2015-06-24 DIAGNOSIS — M6289 Other specified disorders of muscle: Secondary | ICD-10-CM

## 2015-06-24 DIAGNOSIS — N8184 Pelvic muscle wasting: Secondary | ICD-10-CM | POA: Diagnosis not present

## 2015-06-24 DIAGNOSIS — R109 Unspecified abdominal pain: Secondary | ICD-10-CM

## 2015-06-24 NOTE — Therapy (Signed)
[facility] [address], [address] [place], [place], [address] Phone: [phone]   Fax:  [phone]  Physical Therapy Treatment  Patient Details  Name: Selena Mckay MRN: [id number] Date of Birth: 12/01/1955 Referring Provider: Dr. Almond LintFaera Byerly  Encounter Date: 06/24/2015      PT End of Session - 06/24/15 1149    Visit Number 8   Date for PT Re-Evaluation 08/06/15   PT Start Time 1145   PT Stop Time 1225   PT Time Calculation (min) 40 min   Activity Tolerance Patient tolerated treatment well   Behavior During Therapy [facility] for tasks assessed/performed      Past Medical History  Diagnosis Date  . Arthritis     Rheumatoid arthritis  . Gastroparesis   . Interstitial cystitis   . Anxiety   . Thyroid disease     Past Surgical History  Procedure Laterality Date  . Spine surgery      3 posterior fusions; 1 anterior fusion    There were no vitals filed for this visit.  Visit Diagnosis:  Pelvic floor dysfunction  Abdominal pain, unspecified abdominal location      Subjective Assessment - 06/24/15 1150    Subjective My stomach is having problems due to sinus drainage.  I am using the heat due to sinus drainage.  I coping better when I have a cold.    Pertinent History     Patient Stated Goals massage out adhesions to improve quality of life.   Currently in Pain? No/denies                      Pelvic Floor Special Questions - 06/24/15 0001    Biofeedback relaxing breathing for decrease tone of pelvic floor, decreased by 8 uv;    Biofeedback sensor type Surface  rectal   Biofeedback Activity Other  relax pelvic floor as pushing bowel out           [facility] Adult [facility] Treatment/Exercise - 06/24/15 0001    Manual Therapy   Manual Therapy Soft tissue mobilization;Myofascial release   Soft tissue mobilization scar massage to abdomin; diaphragm bil. ; lift up of scar   Myofascial Release abdominal ,  lower intestine area, pubivesical ligaments; around the large intestines,                 PT Education - 06/24/15 1224    Education provided Yes   Education Details relaxation of pelvic floor with deep breathing and while having a bowel movement   Person(s) Educated Patient   Methods Explanation;Demonstration   Comprehension Verbalized understanding;Returned demonstration          PT Short Term Goals - 05/14/15 1523    PT SHORT TERM GOAL #3   Title understand how to relax pelvic floor while having a bowel movement   Time 4   Period Weeks   Status Achieved           PT Long Term Goals - 06/24/15 1152    PT LONG TERM GOAL #1   Title independent with HEP and how to manage her pain   Time 4   Period Months   Status On-going   PT LONG TERM GOAL #2   Title pain at night in the abdomen decreased >/= 50% using relaxation techniques and massage   Time 4   Period Months   Status On-going  30% better   PT LONG TERM GOAL #3   Title  have a bowel movement with >/= 50% greater ease due to understanding how to relax the pelvic floor as she pushes   Time 4   Period Months   Status Achieved   PT LONG TERM GOAL #4   Title feels her quality of life has improved >/= 50% due to reduced pain   Time 4   Period Weeks   Status On-going               Plan - 06/24/15 1224    Clinical Impression Statement Patient is a 60 year old female with pelvic floor dysfunction.  Patient is aware of how to relax pelvic floor with breathing while having a bowel movements and when she has the heart palpatations.  Patient is havign less time of heat palpatation.  Patient will benefit form physical therapy to reduce tension in pelvic floor.    Pt will benefit from skilled therapeutic intervention in order to improve on the following deficits Decreased strength;Decreased coordination;Pain;Increased muscle spasms   Rehab Potential Good   Clinical Impairments Affecting Rehab Potential None    PT Frequency 1x / week   PT Duration Other (comment)  4 months   PT Treatment/Interventions ADLs/Self Care Home Management;Biofeedback;Moist Heat;Therapeutic exercise;Electrical Stimulation;Cryotherapy;Therapeutic activities;Ultrasound;Neuromuscular re-education;Patient/family education;Manual techniques;Scar mobilization   PT Next Visit Plan soft tissue work,    PT Home Exercise Plan progress as needed   Consulted and Agree with Plan of Care Patient        Problem List There are no active problems to display for this patient.   Eulis Foster, PT 06/24/2015 12:28 PM   [facility] [address] [place], [place], [address] Phone: [phone]   Fax:  [phone]  Name: Selena Mckay MRN: [id number] Date of Birth: [birth year]

## 2015-07-01 ENCOUNTER — Encounter: Payer: Self-pay | Admitting: Physical Therapy

## 2015-07-01 ENCOUNTER — Ambulatory Visit: Payer: BC Managed Care – PPO | Attending: General Surgery | Admitting: Physical Therapy

## 2015-07-01 DIAGNOSIS — M62838 Other muscle spasm: Secondary | ICD-10-CM | POA: Insufficient documentation

## 2015-07-01 NOTE — Therapy (Addendum)
[facility] [address], [address] [place], [place], [address] Phone: [phone]   Fax:  [phone]  Physical Therapy Treatment  Patient Details  Name: Hilton Corkadine Jarosz MRN: [id number] Date of Birth: 12/14/1955 Referring Provider: Dr. Almond LintFaera Byerly  Encounter Date: 07/01/2015      PT End of Session - 07/01/15 1148    Visit Number 9   Date for PT Re-Evaluation 08/06/15   PT Start Time 1145   PT Stop Time 1225   PT Time Calculation (min) 40 min   Activity Tolerance Patient tolerated treatment well   Behavior During Therapy [facility] for tasks assessed/performed      Past Medical History  Diagnosis Date  . Arthritis     Rheumatoid arthritis  . Gastroparesis   . Interstitial cystitis   . Anxiety   . Thyroid disease     Past Surgical History  Procedure Laterality Date  . Spine surgery      3 posterior fusions; 1 anterior fusion    There were no vitals filed for this visit.  Visit Diagnosis: other muscle spasm Eulis FosterCheryl Geneva Pallas, PT 07/08/2015 11:52 AM        Subjective Assessment - 07/01/15 1148    Subjective I was able to relax the muscles with a bowel movement and made a difference. I had alot of sinus drainage so have not felt good.  I have not been belching alot. I have learned how to relax more now.    Patient Stated Goals massage out adhesions to improve quality of life.   Currently in Pain? Yes   Pain Score 6    Pain Location Abdomen   Pain Orientation Lower   Pain Descriptors / Indicators Cramping   Pain Type Chronic pain   Pain Onset More than a month ago   Pain Frequency Intermittent   Aggravating Factors  sinus drainage, anxiety   Pain Relieving Factors hot pack   Multiple Pain Sites No                         [facility] Adult PT Treatment/Exercise - 07/01/15 0001    Manual Therapy   Manual Therapy Soft tissue mobilization;Myofascial release   Soft tissue mobilization scar massage to abdomin;  diaphragm bil. ; lift up of scar   Myofascial Release tissue rolling on abdominal tissue                PT Education - 07/01/15 1223    Education provided Yes   Education Details education on how to perform soft tissue work in diaphgram in sitting with fingers in the dipahgram.   Person(s) Educated Patient   Methods Explanation;Demonstration   Comprehension Verbalized understanding;Returned demonstration          PT Short Term Goals - 05/14/15 1523    PT SHORT TERM GOAL #3   Title understand how to relax pelvic floor while having a bowel movement   Time 4   Period Weeks   Status Achieved           PT Long Term Goals - 07/01/15 1153    PT LONG TERM GOAL #1   Title independent with HEP and how to manage her pain   Time 4   Period Months   Status On-going  still learning   PT LONG TERM GOAL #2   Title pain at night in the abdomen decreased >/= 50% using relaxation techniques and massage   Time 4  Period Months   Status On-going  has it daily and breaths to relax, 30% better   PT LONG TERM GOAL #3   Title have a bowel movement with >/= 50% greater ease due to understanding how to relax the pelvic floor as she pushes   Time 4   Period Months   Status Achieved  75% easier   PT LONG TERM GOAL #4   Title feels her quality of life has improved >/= 50% due to reduced pain   Time 4   Period Weeks   Status On-going  35% better               Plan - 07/01/15 1225    Clinical Impression Statement Patient is a 60 year old female with diagnosis of pelvic floor dysfunction.  Patient reports her pain and quality of life ie 355 better.  Patient has learned how to perform self diaphragm  release to assis tin her belching.  Patient has learned that if she relaxes her pelvic floor she is able to have a bowel movement correctly.  Patient will benefit frmp hysical therapy to reduce muscle tension.    Pt will benefit from skilled therapeutic intervention in order to  improve on the following deficits Decreased strength;Decreased coordination;Pain;Increased muscle spasms   Rehab Potential Good   Clinical Impairments Affecting Rehab Potential None   PT Frequency 1x / week   PT Duration Other (comment)  4 months   PT Treatment/Interventions ADLs/Self Care Home Management;Biofeedback;Moist Heat;Therapeutic exercise;Electrical Stimulation;Cryotherapy;Therapeutic activities;Ultrasound;Neuromuscular re-education;Patient/family education;Manual techniques;Scar mobilization   PT Next Visit Plan Discharge patient at next visit   PT Home Exercise Plan progress as needed   Consulted and Agree with Plan of Care Patient        Problem List There are no active problems to display for this patient.   Eulis Foster, PT 07/01/2015 12:29 PM   [facility] [address], [address] [place], [place], [address] Phone: [phone]   Fax:  [phone]  Name: Naviyah Schaffert MRN: [id number] Date of Birth: 01/05/1956

## 2015-07-08 ENCOUNTER — Ambulatory Visit: Payer: BC Managed Care – PPO | Admitting: Physical Therapy

## 2015-07-08 ENCOUNTER — Encounter: Payer: Self-pay | Admitting: Physical Therapy

## 2015-07-08 DIAGNOSIS — M62838 Other muscle spasm: Secondary | ICD-10-CM | POA: Diagnosis not present

## 2015-07-08 NOTE — Therapy (Signed)
[facility] [address], [address] [place], [place], [address] Phone: [phone]   Fax:  [phone]  Physical Therapy Treatment  Patient Details  Name: Selena Mckay MRN: [id number] Date of Birth: 05-20-1955 Referring Provider: Dr. Almond Lint  Encounter Date: 07/08/2015      PT End of Session - 07/08/15 1233    Visit Number 10   Date for PT Re-Evaluation 08/06/15   PT Start Time 1145   PT Stop Time 1230   PT Time Calculation (min) 45 min   Activity Tolerance Patient tolerated treatment well   Behavior During Therapy [facility] for tasks assessed/performed      Past Medical History  Diagnosis Date  . Arthritis     Rheumatoid arthritis  . Gastroparesis   . Interstitial cystitis   . Anxiety   . Thyroid disease     Past Surgical History  Procedure Laterality Date  . Spine surgery      3 posterior fusions; 1 anterior fusion    There were no vitals filed for this visit.      Subjective Assessment - 07/08/15 1153    Subjective I am fatiqued with this bug I am trying to get over it. I am coping better. I am having an excessive amount of gas. In the morning after I expell gas I can feel things moving through my intestines.    Patient Stated Goals massage out adhesions to improve quality of life.   Currently in Pain? No/denies            [facility] PT Assessment - 07/08/15 0001    Assessment   Medical Diagnosis R10.9 Abdominal wall pain   Onset Date/Surgical Date 06/26/08   Prior Therapy None   Precautions   Precautions Back  fusions   Restrictions   Weight Bearing Restrictions No   Balance Screen   Has the patient fallen in the past 6 months No   Has the patient had a decrease in activity level because of a fear of falling?  No   Is the patient reluctant to leave their home because of a fear of falling?  No   Prior Function   Level of Independence Independent   Leisure walking, back exercises   Cognition   Overall Cognitive Status Within Functional Limits for tasks assessed   Observation/Other Assessments   Observations Patient is very anxious   Focus on Therapeutic Outcomes (FOTO)  55% limitation CK                               PT Short Term Goals - 05/14/15 1523    PT SHORT TERM GOAL #3   Title understand how to relax pelvic floor while having a bowel movement   Time 4   Period Weeks   Status Achieved           PT Long Term Goals - 07/08/15 1156    PT LONG TERM GOAL #1   Title independent with HEP and how to manage her pain   Time 4   Period Months   Status Achieved   PT LONG TERM GOAL #2   Title pain at night in the abdomen decreased >/= 50% using relaxation techniques and massage   Time 4   Period Months   Status Achieved   PT LONG TERM GOAL #3   Title have a bowel movement with >/= 50% greater ease due to understanding how  to relax the pelvic floor as she pushes   Time 4   Period Months   Status Achieved   PT LONG TERM GOAL #4   Title feels her quality of life has improved >/= 50% due to reduced pain   Time 4   Period Weeks   Status Achieved               Plan - 07/08/15 1226    Clinical Impression Statement Patietn is a 60 year old female with diagnosis of pelvic floor dysfunciton who has met her goals.  Patient has improved mobility of abdominal tissue and scar on abdomen.  Patient has full strength of bil. hips.  Patient understands how to manage her pain.  Patient will belch and do bridges to expel the gas that bothers her intestins.     Rehab Potential Good   Clinical Impairments Affecting Rehab Potential None   PT Treatment/Interventions ADLs/Self Care Home Management;Biofeedback;Moist Heat;Therapeutic exercise;Electrical Stimulation;Cryotherapy;Therapeutic activities;Ultrasound;Neuromuscular re-education;Patient/family education;Manual techniques;Scar mobilization   PT Next Visit Plan Discharge to HEP   PT Home Exercise Plan  current HEP   Consulted and Agree with Plan of Care Patient      Patient will benefit from skilled therapeutic intervention in order to improve the following deficits and impairments:  Decreased strength, Decreased coordination, Pain, Increased muscle spasms  Visit Diagnosis: Other muscle spasm - Plan: PT plan of care cert/re-cert     Problem List There are no active problems to display for this patient.   Earlie Counts, PT 07/08/2015 12:33 PM   [facility] [address], [place], [place], [address] Phone: [phone]   Fax:  [phone]  Name: Cylie Dor MRN: [id number] Date of Birth: Apr 15, 1955 PHYSICAL THERAPY DISCHARGE SUMMARY  Visits from Start of Care: 10  Current functional level related to goals / functional outcomes: See above    Remaining deficits: See above  Education / Equipment: HEP Plan: Patient agrees to discharge.  Patient goals were met. Patient is being discharged due to meeting the stated rehab goals.  Thank you for the referral. Earlie Counts, PT 07/08/2015 12:33 PM  ?????

## 2017-06-25 IMAGING — US US AORTA
1 series · 14 of 16 positions shown · non-contrast
Comparison: None.

CLINICAL DATA: Pulsatile abdominal mass.

EXAM:
ULTRASOUND OF ABDOMINAL AORTA
TECHNIQUE: Ultrasound examination of the abdominal aorta was performed to
evaluate for abdominal aortic aneurysm.

[Series 1: us aorta · 0.20mm/px · 14 of 16 slices shown]
[im 1/16]
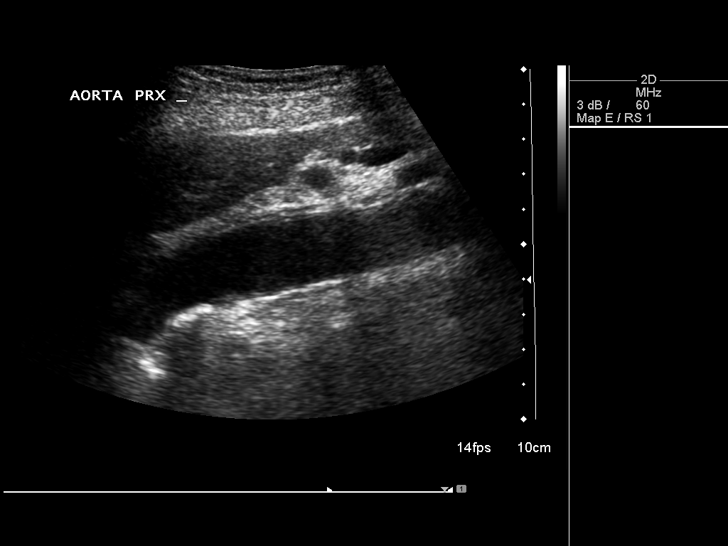
[im 2/16]
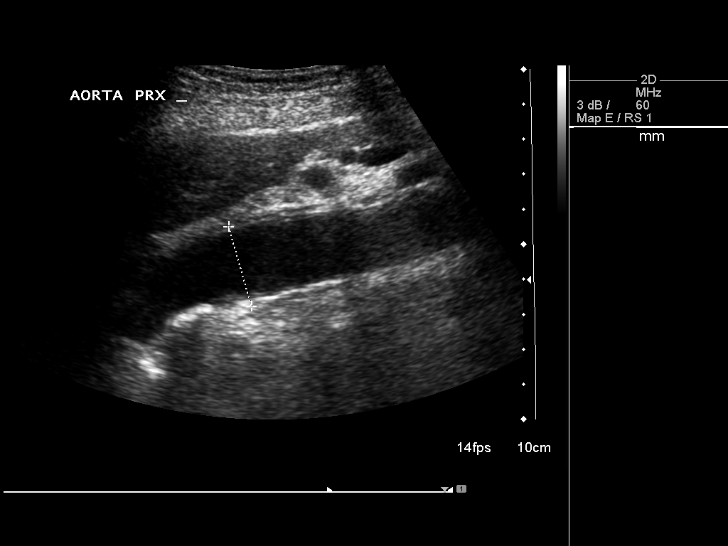
[im 3/16]
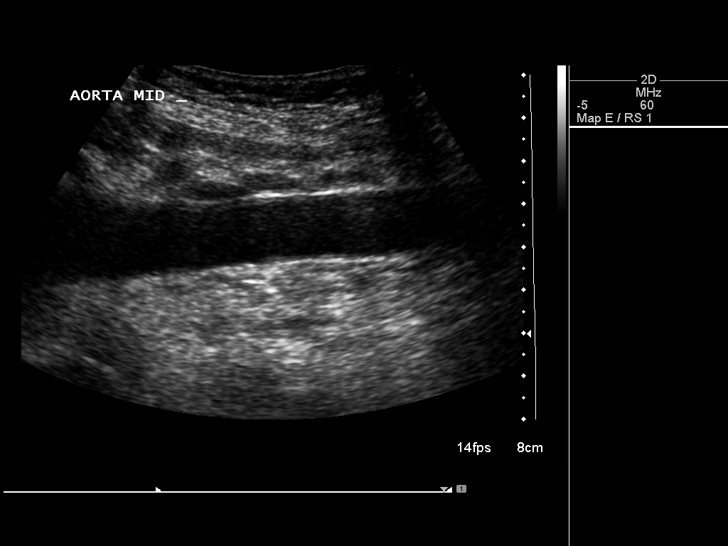
[im 5/16]
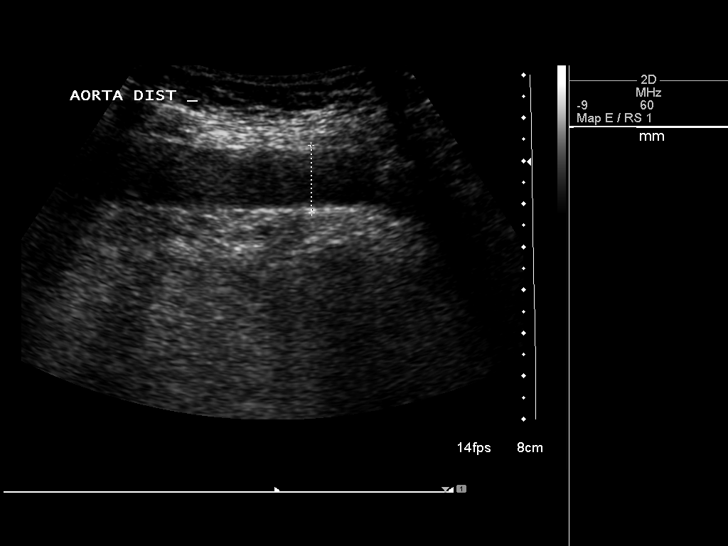
[im 6/16]
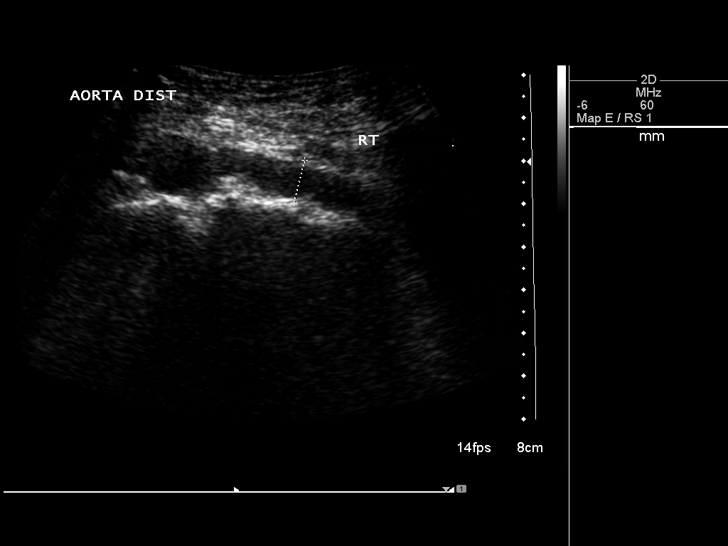
[im 7/16]
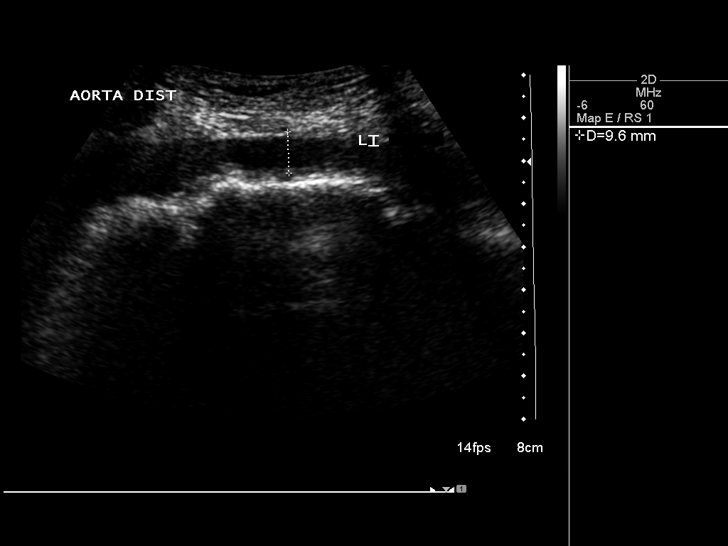
[im 8/16]
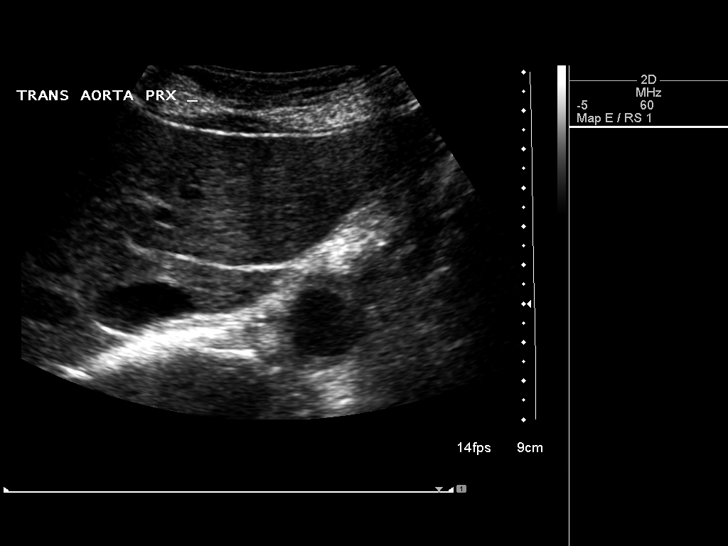
[im 9/16]
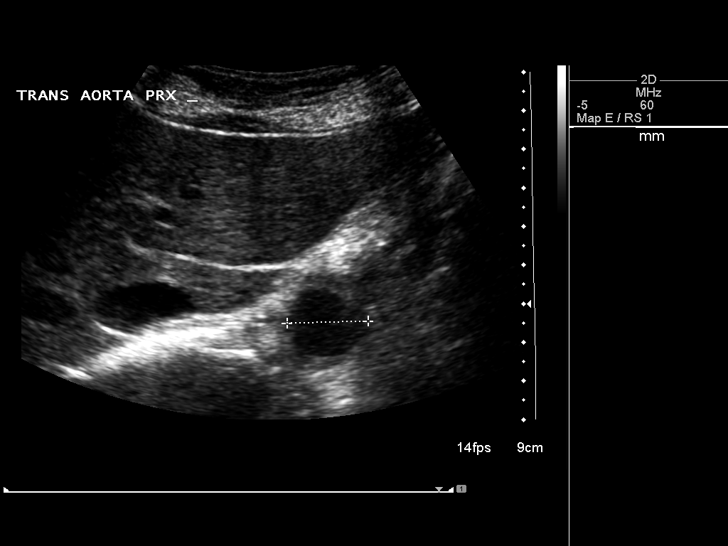
[im 10/16]
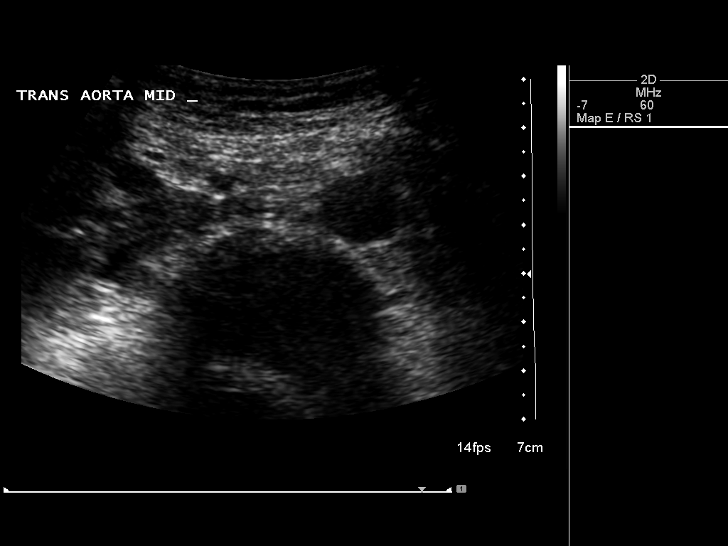
[im 11/16]
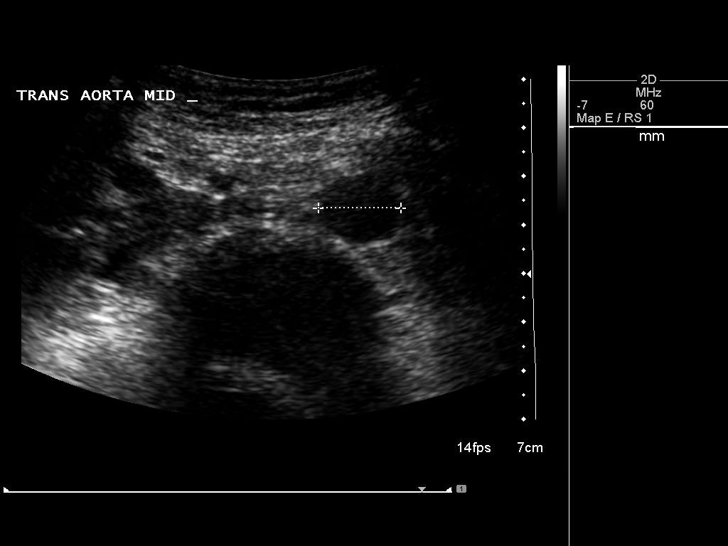
[im 13/16]
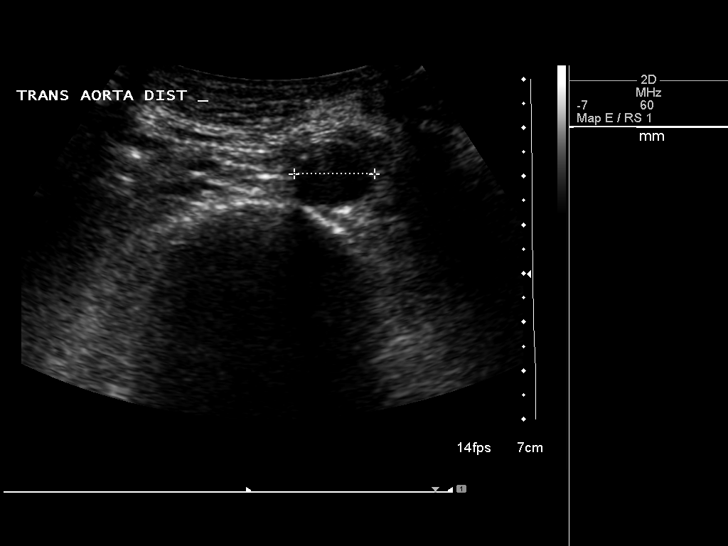
[im 14/16]
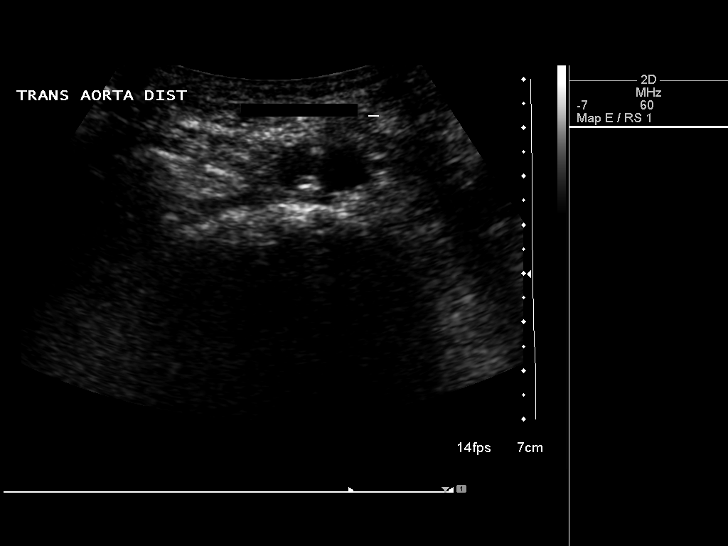
[im 15/16]
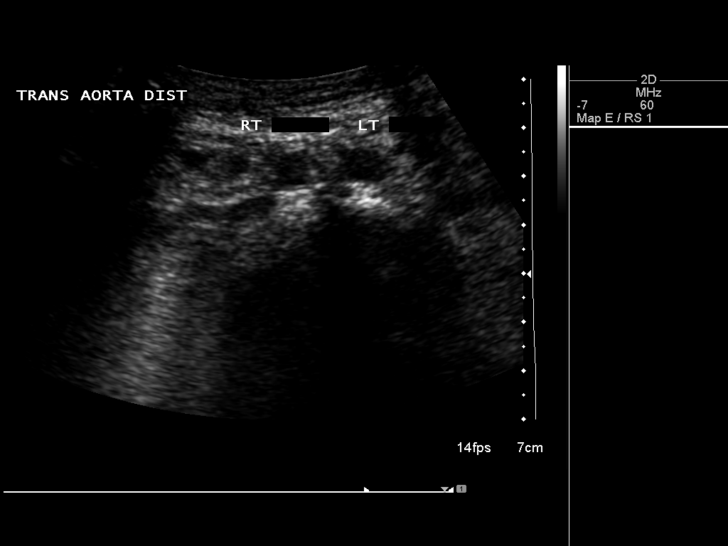
[im 16/16]
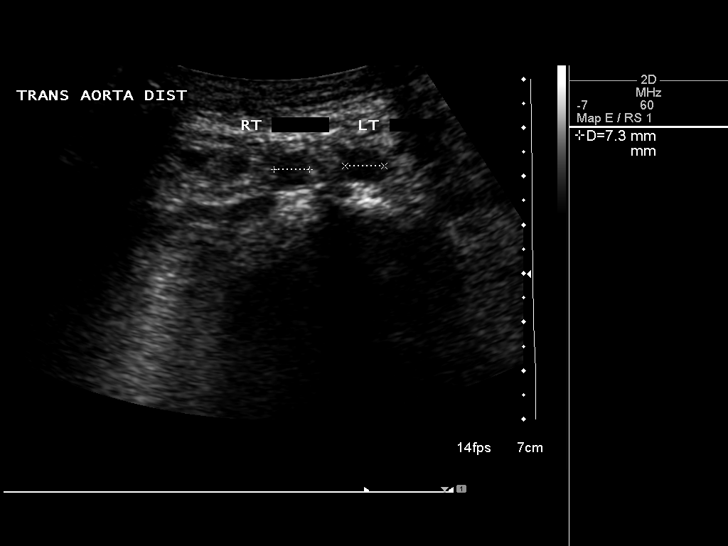

[14 of 16 positions shown; findings below may reference images not displayed]

FINDINGS: Abdominal Aorta

No aneurysm identified.

Maximum Diameter: 2.4 cm proximally which is within normal limits.

Right common iliac artery measures 1 cm in diameter. Left common
iliac artery measures 1 cm in diameter.
IMPRESSION: No evidence of abdominal aortic aneurysm.

## 2017-06-26 ENCOUNTER — Other Ambulatory Visit: Payer: Self-pay | Admitting: Family Medicine

## 2017-06-26 DIAGNOSIS — Z1231 Encounter for screening mammogram for malignant neoplasm of breast: Secondary | ICD-10-CM

## 2017-07-17 ENCOUNTER — Encounter: Payer: Self-pay | Admitting: Radiology

## 2017-07-17 ENCOUNTER — Ambulatory Visit
Admission: RE | Admit: 2017-07-17 | Discharge: 2017-07-17 | Disposition: A | Discharge status: Home / Self Care | Payer: BC Managed Care – PPO | Source: Ambulatory Visit | Attending: Family Medicine | Admitting: Family Medicine

## 2017-07-17 DIAGNOSIS — Z1231 Encounter for screening mammogram for malignant neoplasm of breast: Secondary | ICD-10-CM

## 2018-10-19 ENCOUNTER — Other Ambulatory Visit: Payer: Self-pay | Admitting: Family Medicine

## 2018-10-19 ENCOUNTER — Other Ambulatory Visit (HOSPITAL_COMMUNITY)
Admission: RE | Admit: 2018-10-19 | Discharge: 2018-10-19 | Disposition: A | Discharge status: Home / Self Care | Payer: BC Managed Care – PPO | Source: Ambulatory Visit | Attending: Family Medicine | Admitting: Family Medicine

## 2018-10-19 DIAGNOSIS — Z01411 Encounter for gynecological examination (general) (routine) with abnormal findings: Secondary | ICD-10-CM | POA: Diagnosis present

## 2018-10-22 LAB — CYTOLOGY - PAP: Diagnosis: NEGATIVE

## 2020-01-07 ENCOUNTER — Other Ambulatory Visit: Payer: Self-pay

## 2020-01-07 ENCOUNTER — Ambulatory Visit: Payer: BC Managed Care – PPO | Attending: Orthopaedic Surgery

## 2020-01-07 DIAGNOSIS — M545 Low back pain, unspecified: Secondary | ICD-10-CM | POA: Insufficient documentation

## 2020-01-07 DIAGNOSIS — R252 Cramp and spasm: Secondary | ICD-10-CM | POA: Insufficient documentation

## 2020-01-07 DIAGNOSIS — M6281 Muscle weakness (generalized): Secondary | ICD-10-CM

## 2020-01-07 DIAGNOSIS — G8929 Other chronic pain: Secondary | ICD-10-CM | POA: Insufficient documentation
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
## 2020-01-07 NOTE — Patient Instructions (Signed)
Access Code: [email or web address] URL: [email or web address] Date: 01/07/2020 Prepared by: Tresa Endo  Exercises Supine Single Knee to Chest Stretch - 3 x daily - 7 x weekly - 1 sets - 3 reps - 20 hold Supine Piriformis Stretch with Leg Straight - 3 x daily - 7 x weekly - 1 sets - 3 reps - 20 hold Beginner Clam - 2 x daily - 7 x weekly - 2 sets - 10 reps Seated Hamstring Stretch - 3 x daily - 7 x weekly - 1 sets - 3 reps - 20 hold Seated Figure 4 Piriformis Stretch - 3 x daily - 7 x weekly - 1 sets - 3 reps - 20 hold

## 2020-01-13 ENCOUNTER — Other Ambulatory Visit: Payer: Self-pay

## 2020-01-13 ENCOUNTER — Encounter: Payer: Self-pay | Admitting: Physical Therapy

## 2020-01-13 ENCOUNTER — Ambulatory Visit: Payer: BC Managed Care – PPO | Admitting: Physical Therapy

## 2020-01-13 DIAGNOSIS — G8929 Other chronic pain: Secondary | ICD-10-CM

## 2020-01-13 DIAGNOSIS — M6281 Muscle weakness (generalized): Secondary | ICD-10-CM

## 2020-01-13 DIAGNOSIS — M545 Low back pain, unspecified: Secondary | ICD-10-CM | POA: Diagnosis not present

## 2020-01-13 DIAGNOSIS — R252 Cramp and spasm: Secondary | ICD-10-CM

## 2020-01-13 NOTE — Therapy (Signed)
[facility] [address] [place], [place], [address] Phone: [phone]   Fax:  [phone]  Physical Therapy Treatment  Patient Details  Name: Selena Mckay MRN: [id number] Date of Birth: [birth year] Referring Provider (PT): Grandville Silos, MD   Encounter Date: 01/13/2020   PT End of Session - 01/13/20 0844    Visit Number 2    Date for PT Re-Evaluation 02/18/20    Authorization Type BCBS State    PT Start Time 0845    PT Stop Time 0923    PT Time Calculation (min) 38 min    Activity Tolerance Patient tolerated treatment well    Behavior During Therapy [facility] for tasks assessed/performed           Past Medical History:  Diagnosis Date  . Anxiety   . Arthritis    Rheumatoid arthritis  . Gastroparesis   . Interstitial cystitis   . Thyroid disease     Past Surgical History:  Procedure Laterality Date  . SPINE SURGERY     3 posterior fusions; 1 anterior fusion    There were no vitals filed for this visit.   Subjective Assessment - 01/13/20 0845    Subjective Doing HEP    Pertinent History 7 level lumbar fusions- 4 lumbar surgeries.    Currently in Pain? Yes    Pain Score 2     Pain Location Leg    Pain Orientation Right    Pain Descriptors / Indicators Dull;Sore    Aggravating Factors  Sitting too long, standing too long    Pain Relieving Factors walking, laying down, movement    Multiple Pain Sites No                             [facility] Adult PT Treatment/Exercise - 01/13/20 0001      Knee/Hip Exercises: Stretches   Piriformis Stretch --   Hip Er mobilization first: then static stretch RT2x 20 sec:    Piriformis Stretch Limitations good return demo    Other Knee/Hip Stretches SKC RT 2x 20 sec      Knee/Hip Exercises: Supine   Bridges Strengthening;Both;1 set;10 reps    Bridges with Clamshell Strengthening;Both;1 set;5 reps      Knee/Hip Exercises: Sidelying   Clams 10x2,  added red band to progress HEP                   PT Education - 01/13/20 0908    Education Details HEP, added red band to clamshells    Person(s) Educated Patient    Methods Explanation;Demonstration;Verbal cues    Comprehension Verbalized understanding;Returned demonstration            PT Short Term Goals - 01/13/20 0919      PT SHORT TERM GOAL #1   Title be independent in initial HEP    Time 3    Period Weeks    Status Achieved    Target Date 01/28/20             PT Long Term Goals - 01/13/20 0919      PT LONG TERM GOAL #1   Title be independent in advanced HEP    Time 6    Period Weeks    Status On-going                 Plan - 01/13/20 0844    Clinical Impression Statement Pt independent and  compliant in initial HEP. PTA added some initial hip mobilizations pt can do prior to her piriformis stretching. Pt was able to add red band resistance to clamshells and bridging for HEP progression today. No increased pain with todays exercises.    Personal Factors and Comorbidities Comorbidity 2    Comorbidities 4 lumbar surgeries, RA    Examination-Activity Limitations Sit;Stand    Examination-Participation Restrictions Church;Cleaning;Community Activity;Driving    Stability/Clinical Decision Making Evolving/Moderate complexity    Rehab Potential Excellent    PT Frequency 1x / week    PT Duration 6 weeks    PT Treatment/Interventions ADLs/Self Care Home Management;Cryotherapy;Electrical Stimulation;Moist Heat;Gait training;Stair training;Functional mobility training;Therapeutic activities;Therapeutic exercise;Neuromuscular re-education;Manual techniques;Passive range of motion;Dry needling;Taping    PT Next Visit Plan Pt will do HEP next week, then rewturn for follow teh following week: progress HEP as needed. Potential DC at next visit.    PT Home Exercise Plan Access Code: [email or web address]    Consulted and Agree with Plan of Care Patient           Patient  will benefit from skilled therapeutic intervention in order to improve the following deficits and impairments:  Decreased activity tolerance, Decreased strength, Postural dysfunction, Impaired flexibility, Improper body mechanics, Pain, Increased muscle spasms  Visit Diagnosis: Chronic bilateral low back pain without sciatica  Muscle weakness (generalized)  Cramp and spasm     Problem List There are no problems to display for this patient.   Aryon Nham, PTA 01/13/2020, 9:24 AM  [facility] [address] [place], [place], [address] Phone: [phone]   Fax:  [phone]  Name: Selena Mckay MRN: [id number] Date of Birth: [birth year]

## 2020-02-05 ENCOUNTER — Ambulatory Visit: Payer: BC Managed Care – PPO | Attending: Orthopaedic Surgery

## 2020-02-05 ENCOUNTER — Other Ambulatory Visit: Payer: Self-pay

## 2020-02-05 DIAGNOSIS — M6281 Muscle weakness (generalized): Secondary | ICD-10-CM | POA: Insufficient documentation

## 2020-02-05 DIAGNOSIS — G8929 Other chronic pain: Secondary | ICD-10-CM | POA: Insufficient documentation

## 2020-02-05 DIAGNOSIS — R252 Cramp and spasm: Secondary | ICD-10-CM | POA: Insufficient documentation

## 2020-02-05 DIAGNOSIS — M545 Low back pain, unspecified: Secondary | ICD-10-CM | POA: Insufficient documentation

## 2020-02-05 NOTE — Therapy (Addendum)
[facility] [address] [place], [place], [address] Phone: [phone]   Fax:  [phone]  Physical Therapy Treatment  Patient Details  Name: Selena Mckay Patient MRN: [id number] Date of Birth: 05/10/1955 Referring Provider (PT): Arsenio Katz, MD   Encounter Date: 02/05/2020   PT End of Session - 02/05/20 0928    Visit Number 3    Date for PT Re-Evaluation 02/18/20    Authorization Type BCBS State    PT Start Time 0845    PT Stop Time 0927    PT Time Calculation (min) 42 min    Activity Tolerance Patient tolerated treatment well    Behavior During Therapy [facility] [place] [facility] for tasks assessed/performed           Past Medical History:  Diagnosis Date  . Anxiety   . Arthritis    Rheumatoid arthritis  . Gastroparesis   . Interstitial cystitis   . Thyroid disease     Past Surgical History:  Procedure Laterality Date  . SPINE SURGERY     3 posterior fusions; 1 anterior fusion    There were no vitals filed for this visit.   Subjective Assessment - 02/05/20 0850    Subjective I've not been doing well lately.  I am having episodes of Rt LE weakness and pain after doing my exercises.    Pertinent History 7 level lumbar fusions- 4 lumbar surgeries.    Patient Stated Goals sit for 1 hour for meal or church, stand longer, reduce pain    Currently in Pain? Yes    Pain Location Leg    Pain Orientation Right    Pain Descriptors / Indicators Dull;Sore    Pain Type Acute pain    Pain Onset More than a month ago    Pain Frequency Intermittent    Aggravating Factors  doing my exercises makes my leg feel weak and painful, sitting too long    Pain Relieving Factors walking, laying down, movement                             [facility] Adult PT Treatment/Exercise - 02/05/20 0001      Knee/Hip Exercises: Stretches   Active Hamstring Stretch Both;20 seconds;2 reps    Active Hamstring Stretch Limitations with strap-  tactile cues to keep knee straight    Piriformis Stretch Left;Right;2 reps;20 seconds    Piriformis Stretch Limitations diagonal knee to chest with opposite knee bent    Other Knee/Hip Stretches SKC RT 2x 20 sec      Knee/Hip Exercises: Sidelying   Clams 2x5 bil with abdominal bracing      Manual Therapy   Manual Therapy Soft tissue mobilization    Manual therapy comments addaday to Rt gluteals                     PT Short Term Goals - 01/13/20 0919      PT SHORT TERM GOAL #1   Title be independent in initial HEP    Time 3    Period Weeks    Status Achieved    Target Date 01/28/20             PT Long Term Goals - 02/05/20 0859      PT LONG TERM GOAL #1   Title be independent in advanced HEP    Time 6    Period Weeks    Status On-going  PT LONG TERM GOAL #3   Title sit for 1 hour without limitation due to pain to attend church or eat a meal    Baseline better- sat at dinner x 40 minutes and needed to take 2 standing breaks    Time 6    Period Weeks    Status On-going                 Plan - 02/05/20 0929    Clinical Impression Statement Pt arrived frustrated that she has pain and Rt LE pain after doing exercises and then trying to walk.  Pt reported that she was trying to push her stretches, walk 30 minutes and do 20 reps of clamshells without rest. PT discussed the importance of gentle stretching and breaking down reps into sets (4x5 or 2x10) .  Pt was walking 30 minutes which might be contributing to the Rt LE fatigue and pain and PT suggested breaking this down to 3, 10 minute walks or 2, 15 minute walks. Pt reports improved ability to sit with less pain and was able to go out to dinner.  Pt is able to stand for 30 minutes without limitation due to pain.  Pt will return in 2-3 weeks to check in and determine if further advancement of HEP is needed.    Rehab Potential Excellent    PT Frequency 1x / week    PT Duration 6 weeks    PT  Treatment/Interventions ADLs/Self Care Home Management;Cryotherapy;Electrical Stimulation;Moist Heat;Gait training;Stair training;Functional mobility training;Therapeutic activities;Therapeutic exercise;Neuromuscular re-education;Manual techniques;Passive range of motion;Dry needling;Taping    PT Next Visit Plan add to HEP, renewal needed.  Potential D/C    PT Home Exercise Plan Access Code: [email or web address]    Consulted and Agree with Plan of Care Patient           Patient will benefit from skilled therapeutic intervention in order to improve the following deficits and impairments:  Decreased activity tolerance, Decreased strength, Postural dysfunction, Impaired flexibility, Improper body mechanics, Pain, Increased muscle spasms  Visit Diagnosis: Muscle weakness (generalized)  Chronic bilateral low back pain without sciatica  Cramp and spasm     Problem List There are no problems to display for this patient.    Sigurd Sos, PT 02/05/20 9:31 AM PHYSICAL THERAPY DISCHARGE SUMMARY  Visits from Start of Care: 3  Current functional level related to goals / functional outcomes: See above for most current PT status.  Pt requested D/C to HEP.    Remaining deficits: See above for current status.     Education / Equipment: HEP Plan: Patient agrees to discharge.  Patient goals were not met. Patient is being discharged due to the patient's request.  ?????  Sigurd Sos, PT 02/12/20 3:43 PM       [facility] [address] [place], [place], [address] Phone: [phone]   Fax:  [phone]  Name: Cristian Grieves MRN: [id number] Date of Birth: [birth year]

## 2022-12-12 DIAGNOSIS — E559 Vitamin D deficiency, unspecified: Secondary | ICD-10-CM | POA: Diagnosis not present

## 2022-12-12 DIAGNOSIS — E039 Hypothyroidism, unspecified: Secondary | ICD-10-CM | POA: Diagnosis not present

## 2022-12-12 DIAGNOSIS — E538 Deficiency of other specified B group vitamins: Secondary | ICD-10-CM | POA: Diagnosis not present

## 2022-12-12 DIAGNOSIS — Z79899 Other long term (current) drug therapy: Secondary | ICD-10-CM | POA: Diagnosis not present

## 2022-12-12 DIAGNOSIS — E7801 Familial hypercholesterolemia: Secondary | ICD-10-CM | POA: Diagnosis not present

## 2022-12-14 DIAGNOSIS — Z0000 Encounter for general adult medical examination without abnormal findings: Secondary | ICD-10-CM | POA: Diagnosis not present

## 2022-12-14 DIAGNOSIS — Z79899 Other long term (current) drug therapy: Secondary | ICD-10-CM | POA: Diagnosis not present

## 2022-12-14 DIAGNOSIS — Z1211 Encounter for screening for malignant neoplasm of colon: Secondary | ICD-10-CM | POA: Diagnosis not present

## 2022-12-14 DIAGNOSIS — E7801 Familial hypercholesterolemia: Secondary | ICD-10-CM | POA: Diagnosis not present

## 2022-12-14 DIAGNOSIS — F331 Major depressive disorder, recurrent, moderate: Secondary | ICD-10-CM | POA: Diagnosis not present

## 2022-12-14 DIAGNOSIS — E559 Vitamin D deficiency, unspecified: Secondary | ICD-10-CM | POA: Diagnosis not present

## 2022-12-14 DIAGNOSIS — E039 Hypothyroidism, unspecified: Secondary | ICD-10-CM | POA: Diagnosis not present

## 2022-12-14 DIAGNOSIS — E538 Deficiency of other specified B group vitamins: Secondary | ICD-10-CM | POA: Diagnosis not present

## 2022-12-15 DIAGNOSIS — Z1211 Encounter for screening for malignant neoplasm of colon: Secondary | ICD-10-CM | POA: Diagnosis not present

## 2023-01-11 DIAGNOSIS — E039 Hypothyroidism, unspecified: Secondary | ICD-10-CM | POA: Diagnosis not present
